# Patient Record
Sex: Male | Born: 1970 | Race: White | Hispanic: No | Marital: Married | Smoking: Former smoker
Health system: Southern US, Community
[De-identification: ages and names within clinical notes are randomized; demographics above are authoritative.]

## PROBLEM LIST (undated history)

## (undated) DIAGNOSIS — K219 Gastro-esophageal reflux disease without esophagitis: Secondary | ICD-10-CM

## (undated) DIAGNOSIS — F419 Anxiety disorder, unspecified: Secondary | ICD-10-CM

## (undated) HISTORY — PX: WISDOM TOOTH EXTRACTION: SHX21

## (undated) HISTORY — PX: VASECTOMY: SHX75

## (undated) HISTORY — PX: OTHER SURGICAL HISTORY: SHX169

---

## 2001-04-13 ENCOUNTER — Emergency Department (HOSPITAL_COMMUNITY): Admission: EM | Admit: 2001-04-13 | Discharge: 2001-04-14 | Payer: Self-pay | Admitting: Emergency Medicine

## 2007-12-19 ENCOUNTER — Encounter: Admission: RE | Admit: 2007-12-19 | Discharge: 2007-12-19 | Payer: Self-pay | Admitting: Internal Medicine

## 2008-06-17 ENCOUNTER — Encounter: Admission: RE | Admit: 2008-06-17 | Discharge: 2008-06-17 | Payer: Self-pay | Admitting: Internal Medicine

## 2008-11-24 ENCOUNTER — Encounter: Admission: RE | Admit: 2008-11-24 | Discharge: 2008-11-24 | Payer: Self-pay | Admitting: Internal Medicine

## 2008-12-29 ENCOUNTER — Encounter: Admission: RE | Admit: 2008-12-29 | Discharge: 2008-12-29 | Payer: Self-pay | Admitting: Internal Medicine

## 2009-07-01 ENCOUNTER — Encounter: Admission: RE | Admit: 2009-07-01 | Discharge: 2009-07-01 | Payer: Self-pay | Admitting: Internal Medicine

## 2010-11-14 ENCOUNTER — Encounter: Payer: Self-pay | Admitting: Internal Medicine

## 2011-09-09 ENCOUNTER — Emergency Department (HOSPITAL_COMMUNITY)
Admission: EM | Admit: 2011-09-09 | Discharge: 2011-09-10 | Disposition: A | Discharge status: Home / Self Care | Payer: Managed Care, Other (non HMO) | Attending: Emergency Medicine | Admitting: Emergency Medicine

## 2011-09-09 ENCOUNTER — Emergency Department (HOSPITAL_COMMUNITY): Payer: Managed Care, Other (non HMO)

## 2011-09-09 ENCOUNTER — Encounter: Payer: Self-pay | Admitting: Emergency Medicine

## 2011-09-09 DIAGNOSIS — M542 Cervicalgia: Secondary | ICD-10-CM | POA: Insufficient documentation

## 2011-09-09 DIAGNOSIS — R51 Headache: Secondary | ICD-10-CM | POA: Insufficient documentation

## 2011-09-09 HISTORY — DX: Gastro-esophageal reflux disease without esophagitis: K21.9

## 2011-09-09 LAB — POCT I-STAT, CHEM 8
BUN: 17 mg/dL (ref 6–23)
Creatinine, Ser: 1 mg/dL (ref 0.50–1.35)
Hemoglobin: 17 g/dL (ref 13.0–17.0)
Potassium: 4 mEq/L (ref 3.5–5.1)
Sodium: 142 mEq/L (ref 135–145)

## 2011-09-09 MED ORDER — OXYCODONE-ACETAMINOPHEN 5-325 MG PO TABS
1.0000 | ORAL_TABLET | Freq: Once | ORAL | Status: AC
  Administered 2011-09-09: 1 via ORAL
  Filled 2011-09-09: qty 1

## 2011-09-09 NOTE — ED Notes (Signed)
Pt states he is having "rice crispy popping" in the back of his head that radiates up to the top of his head where he is having the feeling of pressure. States it all started at the base of his skull and has worked its way up.  Pt states sxs started last Friday  Pt states he was sitting on the floor earlier today and felt dizzy like he was rocking like being on a boat.  Pt states he has had some numbness in both his hands esp in his little fingers and that his hands have been getting cold lately and he has to sit in front of a heater to warm them up   Pt states he went to his PCP yesterday and was told he may be having spasms in his neck and was given muscle relaxers  Pt states he has not taken any of them  Pt states he also has a dull ringing in his ears

## 2011-09-09 NOTE — ED Provider Notes (Cosign Needed)
History     CSN: [id number] Arrival date & time: 09/09/2011  9:05 PM   First MD Initiated Contact with Patient 09/09/11 2158      Chief Complaint  Patient presents with  . Headache    (Consider location/radiation/quality/duration/timing/severity/associated sxs/prior treatment) HPI Patient reports headache, mostly in the back of his head, described as pressure, and "popping" has been there for the past week. He did see his primary care physician who prescribed muscle relaxer and told him it was likely spasms. He he denies any nausea or vomiting. He denies any visual changes or speech deficits. He does state that his hands feel cold but denies any focal weakness. He's had no fevers. Denies any recent exposures. Patient did not take the muscle relaxers or prescribed. He smokes and drinks on occasion. Past Medical History  Diagnosis Date  . Acid reflux     Past Surgical History  Procedure Date  . Lymph nodes removed from groin area   . Vasectomy   . Wisdom tooth extraction     Family History  Problem Relation Age of Onset  . Thyroid disease Mother     History  Substance Use Topics  . Smoking status: Current Some Day Smoker    Types: Cigarettes  . Smokeless tobacco: Not on file  . Alcohol Use: 8.4 oz/week    14 Cans of beer per week      Review of Systems  All other systems reviewed and are negative.    Allergies  Review of patient's allergies indicates no known allergies.  Home Medications   Current Outpatient Rx  Name Route Sig Dispense Refill  . ASPIRIN PO Oral Take 1 tablet by mouth daily as needed. For headache or pain     . OMEPRAZOLE MAGNESIUM 20 MG PO TBEC Oral Take 20 mg by mouth daily.       BP 137/73  Pulse 86  Temp(Src) 98.1 F (36.7 C) (Oral)  Resp 18  SpO2 98%  Physical Exam  Constitutional: He is oriented to person, place, and time. He appears well-developed and well-nourished.  HENT:  Head: Normocephalic and atraumatic.  Eyes:  Conjunctivae and EOM are normal. Pupils are equal, round, and reactive to light.  Neck: Neck supple.  Cardiovascular: Normal rate and regular rhythm.  Exam reveals no gallop and no friction rub.   No murmur heard. Pulmonary/Chest: Breath sounds normal. He has no wheezes. He has no rales. He exhibits no tenderness.  Abdominal: Soft. Bowel sounds are normal. He exhibits no distension. There is no tenderness. There is no rebound and no guarding.  Musculoskeletal: Normal range of motion.  Neurological: He is alert and oriented to person, place, and time. He has normal reflexes. No cranial nerve deficit. Coordination normal.       Grip strength equal, deep tendon reflexes equal and symmetric, awake, alert. Essentially, normal exam.  Skin: Skin is warm and dry. No rash noted.  Psychiatric: He has a normal mood and affect.    ED Course  Procedures (including critical care time)  Labs Reviewed - No data to display No results found.   No diagnosis found.    MDM  Pt is seen and examined;  Initial history and physical completed.  Will follow.          Mitsue Peery A. Patrica Duel, MD 09/09/11 2306  Results for orders placed during the hospital encounter of 09/09/11  POCT I-STAT, CHEM 8      Component Value Range   Sodium 142  135 - 145 (mEq/L)   Potassium 4.0  3.5 - 5.1 (mEq/L)   Chloride 106  96 - 112 (mEq/L)   BUN 17  6 - 23 (mg/dL)   Creatinine, Ser 0.45  0.50 - 1.35 (mg/dL)   Glucose, Bld 409 (*) 70 - 99 (mg/dL)   Calcium, Ion 8.11  9.14 - 1.32 (mmol/L)   TCO2 26  0 - 100 (mmol/L)   Hemoglobin 17.0  13.0 - 17.0 (g/dL)   HCT 78.2  95.6 - 21.3 (%)   Ct Head Wo Contrast  09/09/2011  *RADIOLOGY REPORT*  Clinical Data: Headache, neck pain.  CT HEAD WITHOUT CONTRAST,CT CERVICAL SPINE WITHOUT CONTRAST  Technique:  Contiguous axial images were obtained from the base of the skull through the vertex without contrast.,Technique: Multidetector CT imaging of the cervical spine was performed.  Multiplanar CT image reconstructions were also generated.  Comparison: None.  Findings:  Head: There is no evidence for acute hemorrhage, hydrocephalus, mass lesion, or abnormal extra-axial fluid collection.  No definite CT evidence for acute infarction.  The visualized paranasal sinuses and mastoid air cells are predominately clear.  Cervical spine:  Maintained craniocervical relationship.  Loss of normal cervical lordosis.  Vertebral body height and alignment otherwise maintained.  Intervertebral disc spaces are maintained. No prevertebral or paravertebral soft tissue swelling.  No aggressive osseous abnormality.  The lung apices are clear.  There is a nonspecific 8 mm hypodensity within the right lobe of thyroid gland.  IMPRESSION: No acute intracranial abnormality.  Loss of normal cervical lordosis is likely positional or secondary to muscle spasm.  No acute fracture or dislocation identified.  8 mm hypodensity within the right lobe of the thyroid gland.  Can be further characterize with a non emergent ultrasound follow-up.  Original Report Authenticated By: Waneta Martins, M.D.   Ct Cervical Spine Wo Contrast  09/09/2011  *RADIOLOGY REPORT*  Clinical Data: Headache, neck pain.  CT HEAD WITHOUT CONTRAST,CT CERVICAL SPINE WITHOUT CONTRAST  Technique:  Contiguous axial images were obtained from the base of the skull through the vertex without contrast.,Technique: Multidetector CT imaging of the cervical spine was performed. Multiplanar CT image reconstructions were also generated.  Comparison: None.  Findings:  Head: There is no evidence for acute hemorrhage, hydrocephalus, mass lesion, or abnormal extra-axial fluid collection.  No definite CT evidence for acute infarction.  The visualized paranasal sinuses and mastoid air cells are predominately clear.  Cervical spine:  Maintained craniocervical relationship.  Loss of normal cervical lordosis.  Vertebral body height and alignment otherwise maintained.   Intervertebral disc spaces are maintained. No prevertebral or paravertebral soft tissue swelling.  No aggressive osseous abnormality.  The lung apices are clear.  There is a nonspecific 8 mm hypodensity within the right lobe of thyroid gland.  IMPRESSION: No acute intracranial abnormality.  Loss of normal cervical lordosis is likely positional or secondary to muscle spasm.  No acute fracture or dislocation identified.  8 mm hypodensity within the right lobe of the thyroid gland.  Can be further characterize with a non emergent ultrasound follow-up.  Original Report Authenticated By: Waneta Martins, M.D.      Armando Bukhari A. Patrica Duel, MD 09/10/11 0865

## 2011-09-09 NOTE — ED Notes (Signed)
Patient transported to CT 

## 2011-09-09 NOTE — ED Notes (Signed)
Agree with triage note.  Pt states that he has had 'rice crispy' sounds on the back of his head and into his ears.  Pt states that he was kneeling down when he felt dizzy (room spinning).  Pt has normal neuro exam.  Pt states that family has had some flu like symptoms.  Pt states that he has had some heartburn that has been treating with priolosec.  Pt is off/on smoker.  Pt states slight tingling to both hands on 4th-5th digits.  Pt states that this occurs off and on.

## 2013-02-14 ENCOUNTER — Emergency Department (HOSPITAL_COMMUNITY)
Admission: EM | Admit: 2013-02-14 | Discharge: 2013-02-14 | Disposition: A | Discharge status: Home / Self Care | Payer: Managed Care, Other (non HMO) | Attending: Emergency Medicine | Admitting: Emergency Medicine

## 2013-02-14 ENCOUNTER — Encounter (HOSPITAL_COMMUNITY): Payer: Self-pay | Admitting: *Deleted

## 2013-02-14 ENCOUNTER — Emergency Department (HOSPITAL_COMMUNITY): Payer: Managed Care, Other (non HMO)

## 2013-02-14 DIAGNOSIS — N201 Calculus of ureter: Secondary | ICD-10-CM | POA: Insufficient documentation

## 2013-02-14 DIAGNOSIS — K219 Gastro-esophageal reflux disease without esophagitis: Secondary | ICD-10-CM | POA: Insufficient documentation

## 2013-02-14 DIAGNOSIS — R300 Dysuria: Secondary | ICD-10-CM | POA: Insufficient documentation

## 2013-02-14 DIAGNOSIS — Z79899 Other long term (current) drug therapy: Secondary | ICD-10-CM | POA: Insufficient documentation

## 2013-02-14 DIAGNOSIS — K5900 Constipation, unspecified: Secondary | ICD-10-CM | POA: Insufficient documentation

## 2013-02-14 DIAGNOSIS — R350 Frequency of micturition: Secondary | ICD-10-CM | POA: Insufficient documentation

## 2013-02-14 DIAGNOSIS — F17200 Nicotine dependence, unspecified, uncomplicated: Secondary | ICD-10-CM | POA: Insufficient documentation

## 2013-02-14 LAB — URINALYSIS, ROUTINE W REFLEX MICROSCOPIC
Glucose, UA: NEGATIVE mg/dL
Leukocytes, UA: NEGATIVE
Specific Gravity, Urine: 1.024 (ref 1.005–1.030)
Urobilinogen, UA: 0.2 mg/dL (ref 0.0–1.0)

## 2013-02-14 LAB — URINE MICROSCOPIC-ADD ON

## 2013-02-14 MED ORDER — KETOROLAC TROMETHAMINE 30 MG/ML IJ SOLN
30.0000 mg | Freq: Once | INTRAMUSCULAR | Status: AC
  Administered 2013-02-14: 30 mg via INTRAVENOUS
  Filled 2013-02-14: qty 1

## 2013-02-14 MED ORDER — MORPHINE SULFATE 4 MG/ML IJ SOLN
6.0000 mg | Freq: Once | INTRAMUSCULAR | Status: DC
  Filled 2013-02-14: qty 2

## 2013-02-14 MED ORDER — OXYCODONE-ACETAMINOPHEN 5-325 MG PO TABS
2.0000 | ORAL_TABLET | Freq: Once | ORAL | Status: AC
  Administered 2013-02-14: 2 via ORAL
  Filled 2013-02-14: qty 2

## 2013-02-14 MED ORDER — OXYCODONE-ACETAMINOPHEN 5-325 MG PO TABS: 1.0000 | ORAL_TABLET | Freq: Four times a day (QID) | ORAL | Status: DC | PRN

## 2013-02-14 MED ORDER — TAMSULOSIN HCL 0.4 MG PO CAPS: 0.4000 mg | ORAL_CAPSULE | Freq: Every day | ORAL | Status: DC

## 2013-02-14 MED ORDER — ONDANSETRON HCL 4 MG PO TABS: 4.0000 mg | ORAL_TABLET | Freq: Four times a day (QID) | ORAL | Status: DC

## 2013-02-14 MED ORDER — ONDANSETRON 4 MG PO TBDP
8.0000 mg | ORAL_TABLET | Freq: Once | ORAL | Status: AC
  Administered 2013-02-14: 8 mg via ORAL
  Filled 2013-02-14: qty 2

## 2013-02-14 NOTE — ED Provider Notes (Signed)
History     CSN: [id number]  Arrival date & time 02/14/13  7829   First MD Initiated Contact with Patient 02/14/13 [phone]      Chief Complaint  Patient presents with  . Abdominal Pain    (Consider location/radiation/quality/duration/timing/severity/associated sxs/prior treatment) HPI Comments: 42 year old male with no significant past medical history presents to the emergency department complaining of sudden onset right lower quadrant abdominal pain beginning about 5 hours prior to arrival. He describes the pain is sharp and cramping, rated 9/10, radiating around to the right side of his back into his right testicle. He tried taking a Xanax to relax him without relief, however since onset pain has decreased to 7/10. Admits to difficulty urinating. He was able to urinate fine yesterday, however admits to increased frequency. Denies dysuria or hematuria. States it is "hard to poop" and had "rabbit pellet poops" about two hours ago. Last normal bowel movement was later in the day yesterday. Denies associated nausea, vomiting, diaphoresis, fever or chills.  Patient is a 42 y.o. male presenting with abdominal pain. The history is provided by the patient.  Abdominal Pain Associated symptoms: constipation   Associated symptoms: no chest pain, no chills, no dysuria, no fever, no hematuria, no nausea, no shortness of breath and no vomiting     Past Medical History  Diagnosis Date  . Acid reflux     Past Surgical History  Procedure Laterality Date  . Lymph nodes removed from groin area    . Vasectomy    . Wisdom tooth extraction      Family History  Problem Relation Age of Onset  . Thyroid disease Mother     History  Substance Use Topics  . Smoking status: Current Some Day Smoker    Types: Cigarettes  . Smokeless tobacco: Not on file  . Alcohol Use: 8.4 oz/week    14 Cans of beer per week      Review of Systems  Constitutional: Negative for fever, chills, diaphoresis and  appetite change.  Respiratory: Negative for shortness of breath.   Cardiovascular: Negative for chest pain.  Gastrointestinal: Positive for abdominal pain and constipation. Negative for nausea, vomiting and blood in stool.  Genitourinary: Positive for frequency, flank pain and difficulty urinating. Negative for dysuria and hematuria.  All other systems reviewed and are negative.    Allergies  Review of patient's allergies indicates no known allergies.  Home Medications   Current Outpatient Rx  Name  Route  Sig  Dispense  Refill  . ASPIRIN PO   Oral   Take 1 tablet by mouth daily as needed. For headache or pain          . omeprazole (PRILOSEC OTC) 20 MG tablet   Oral   Take 20 mg by mouth daily.            There were no vitals taken for this visit.  Physical Exam  Nursing note and vitals reviewed. Constitutional: He is oriented to person, place, and time. He appears well-developed and well-nourished. No distress.  HENT:  Head: Normocephalic and atraumatic.  Mouth/Throat: Oropharynx is clear and moist.  Eyes: Conjunctivae are normal.  Neck: Normal range of motion. Neck supple.  Cardiovascular: Normal rate, regular rhythm, normal heart sounds and intact distal pulses.   Pulmonary/Chest: Effort normal and breath sounds normal. No respiratory distress.  Abdominal: Soft. Normal appearance and bowel sounds are normal. He exhibits no distension and no mass. There is tenderness in the right lower quadrant. There  is CVA tenderness (right). There is no rigidity, no rebound and no guarding. Hernia confirmed negative in the right inguinal area and confirmed negative in the left inguinal area.    Genitourinary: Testes normal. Right testis shows no mass, no swelling and no tenderness. Left testis shows no mass, no swelling and no tenderness.  Musculoskeletal: Normal range of motion. He exhibits no edema.  Neurological: He is alert and oriented to person, place, and time.  Skin: Skin  is warm and dry.  Psychiatric: He has a normal mood and affect. His behavior is normal.    ED Course  Procedures (including critical care time)  Labs Reviewed  URINALYSIS, ROUTINE W REFLEX MICROSCOPIC - Abnormal; Notable for the following:    Color, Urine AMBER (*)    APPearance CLOUDY (*)    Hgb urine dipstick LARGE (*)    All other components within normal limits  URINE MICROSCOPIC-ADD ON - Abnormal; Notable for the following:    Bacteria, UA MANY (*)    All other components within normal limits   Ct Abdomen Pelvis Wo Contrast  02/14/2013  *RADIOLOGY REPORT*  Clinical Data: Right lower quadrant and right flank pain.  CT ABDOMEN AND PELVIS WITHOUT CONTRAST  Technique:  Multidetector CT imaging of the abdomen and pelvis was performed following the standard protocol without intravenous contrast.  Comparison: None.  Findings: The lung bases are clear.  No pleural or pericardial effusion.  There is mild right hydronephrosis with some stranding about the right kidney and ureter due to a 0.3 cm right ureteral stone just proximal to the UVJ.  The patient has a punctate nonobstructing stone in the upper pole of the left kidney.  The kidneys are otherwise unremarkable.  The gallbladder, liver, spleen, adrenal glands and pancreas appear normal.  No lymphadenopathy or fluid is identified.  The stomach, small and large bowel and appendix appear normal.  No focal bony abnormality is identified.  IMPRESSION: Mild right hydronephrosis due to a 0.3 cm right ureteral stone just proximal to the UVJ.  Tiny nonobstructing stone upper pole left kidney also noted.   Original Report Authenticated By: Holley Dexter, M.D.      1. Ureteral stone       MDM  Probable kidney stone- obtaining CT abdomen/pelvis without contrast, U/A. Percocet for pain. NAD. 8:39 AM Patient beginning to feel nauseated, will give zofran. 10:24 AM CT scan showing 0.3 cm right ureteral stone, no obstruction, mild hydronephrosis.  Patient comfortable, in NAD. Discharged with percocet, zofran, flomax and urology f/u. No UTI associated. Return precautions discussed. Patient states understanding of plan and is agreeable.        Trevor Mace, PA-C 02/14/13 1025

## 2013-02-14 NOTE — ED Notes (Signed)
Pt in from home c/o RLQ abd pain onset today @ 3:00, pt states, "It is hard to poop & pee. I will try to pee & it just dribbles. I just feel backed up." pt denies N/V/D, SOB, CP, pt A&O x4, follows commands, speaks in complete sentences

## 2013-02-14 NOTE — ED Provider Notes (Signed)
Medical screening examination/treatment/procedure(s) were conducted as a shared visit with non-physician practitioner(s) and myself.  I personally evaluated the patient during the encounter  Right sided ureteral stone.  Improved at this time.  Discharge home in good condition with urology followup.  Lyanne Co, MD 02/14/13 1048

## 2013-05-09 IMAGING — CT CT ABD-PELV W/O CM
2 of 4 series · 17 of 46 positions shown, 19 images · non-contrast
Comparison: None.

CLINICAL DATA: Right lower quadrant and right flank pain.

CT ABDOMEN AND PELVIS WITHOUT CONTRAST
TECHNIQUE: Multidetector CT imaging of the abdomen and pelvis was
performed following the standard protocol without intravenous
contrast.

[Series 2: stone >200 lbs 5.0 b31f st · axial · 0.75mm/px · z∈[-461,-36]mm · 14 of 93 slices shown, 16 images]
[im 4/93  soft-tissue]
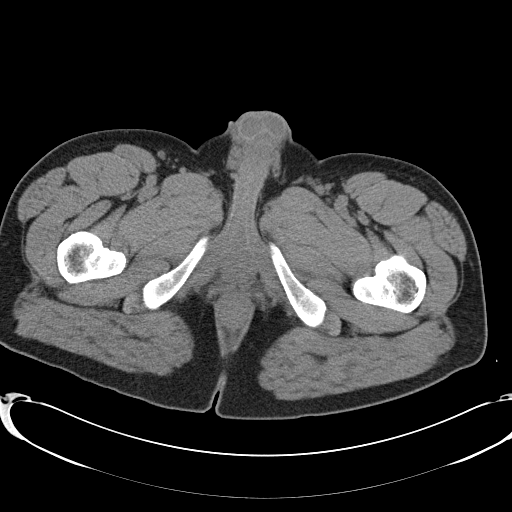
[im 4/93  bone]
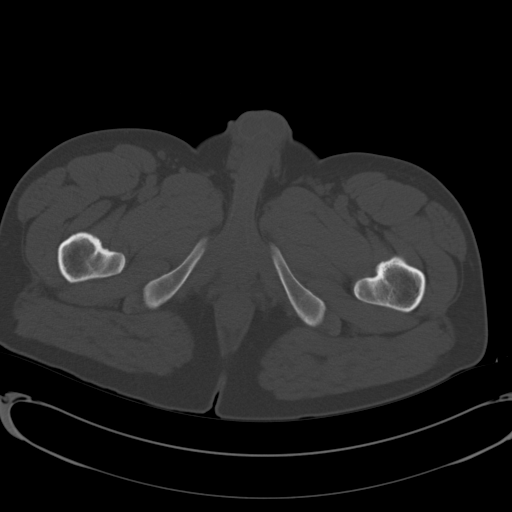
[im 12/93  soft-tissue]
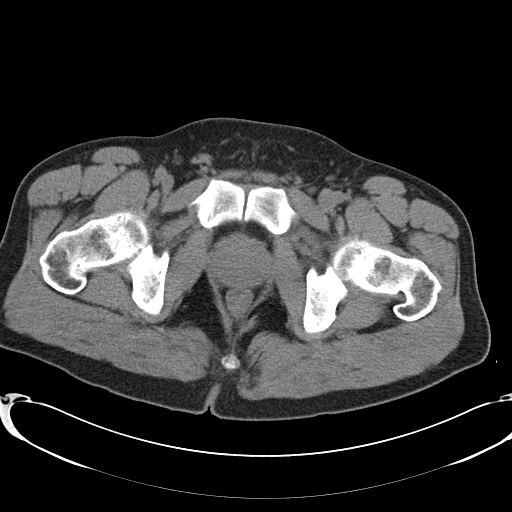
[im 19/93  soft-tissue]
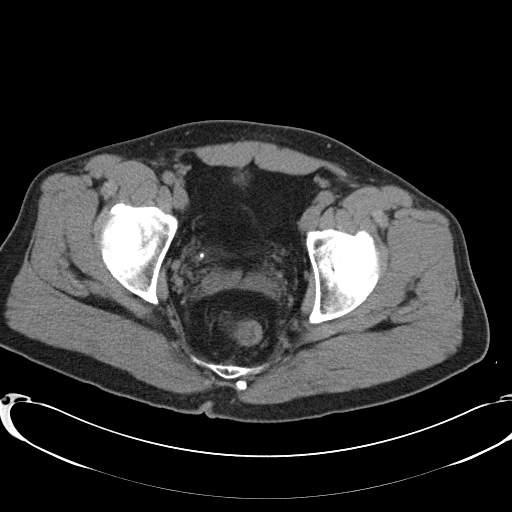
[im 26/93  soft-tissue]
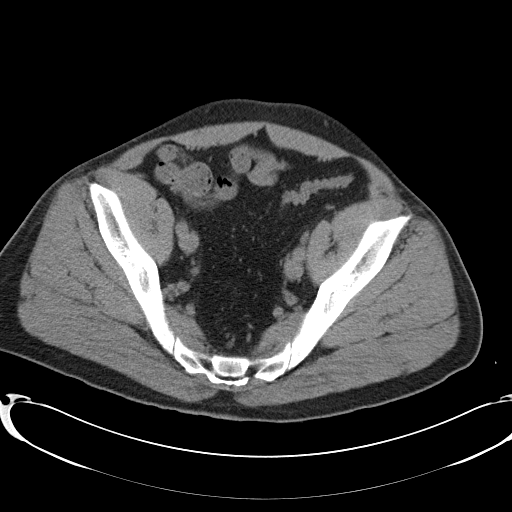
[im 30/93  soft-tissue]
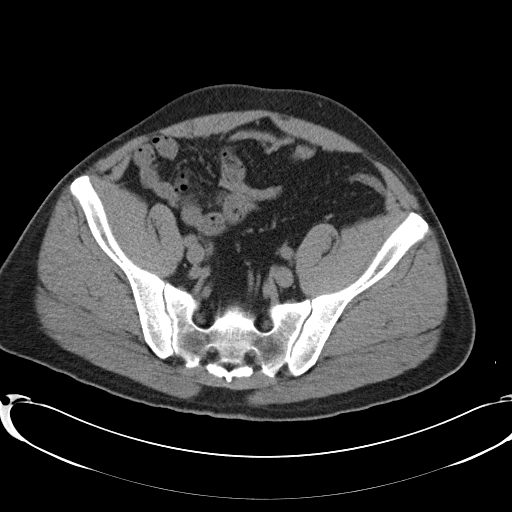
[im 37/93  soft-tissue]
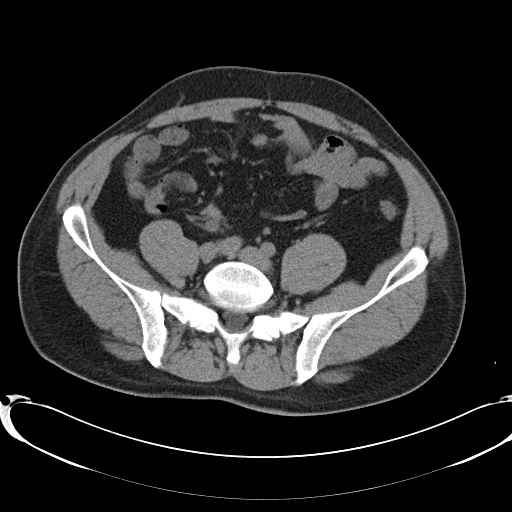
[im 45/93  soft-tissue]
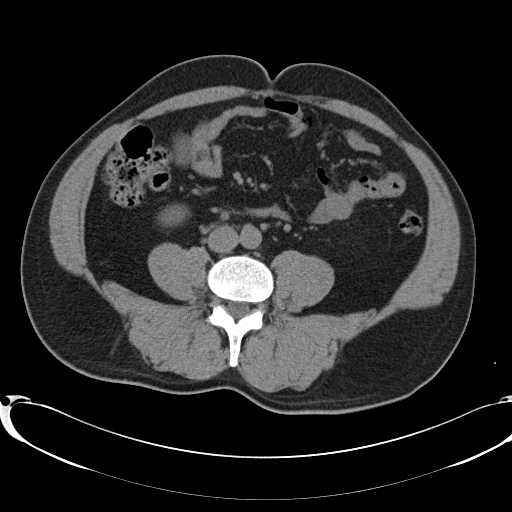
[im 48/93  soft-tissue]
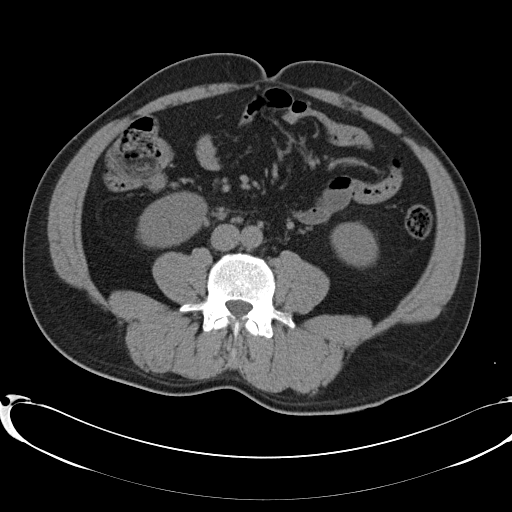
[im 56/93  soft-tissue]
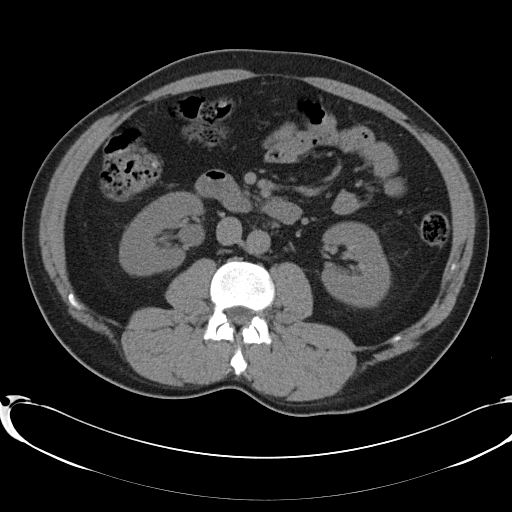
[im 56/93  bone]
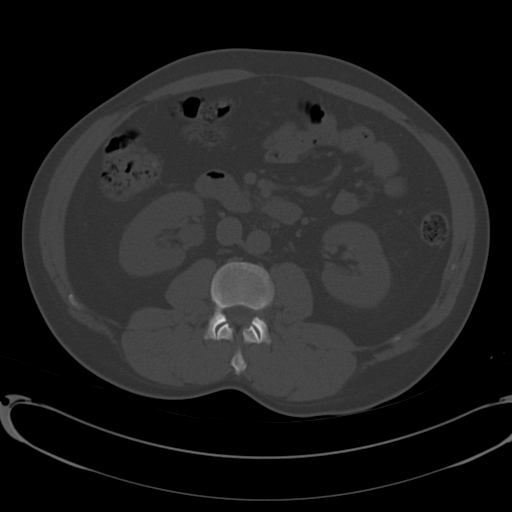
[im 63/93  soft-tissue]
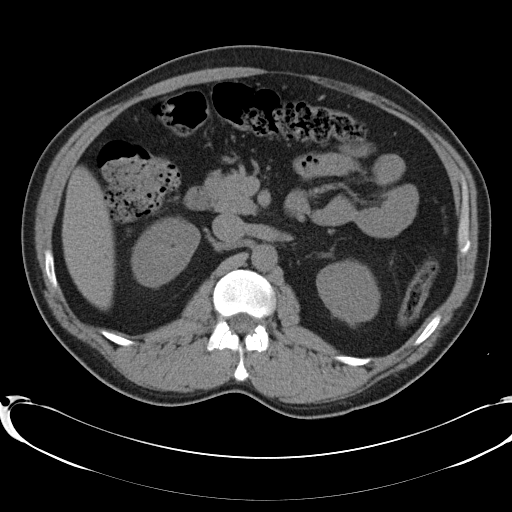
[im 70/93  soft-tissue]
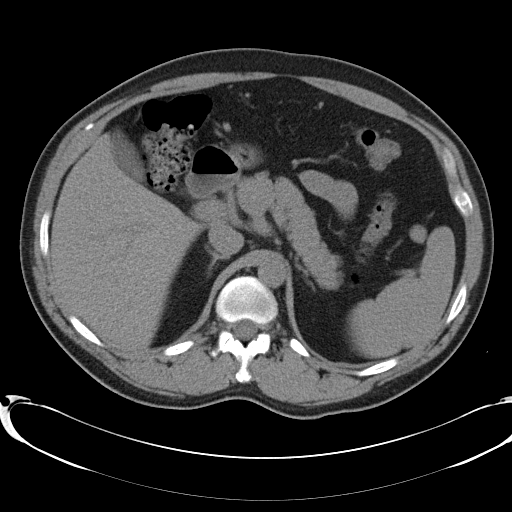
[im 74/93  soft-tissue]
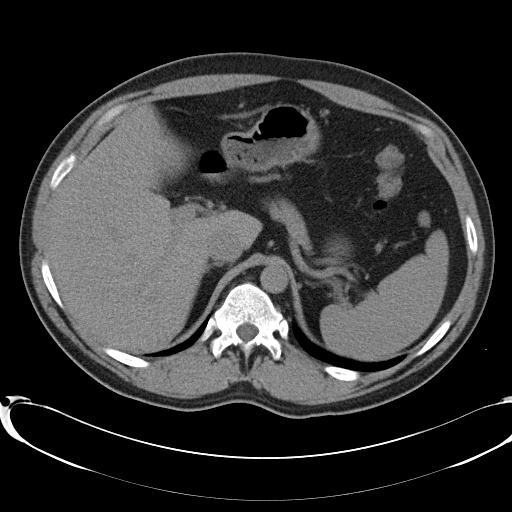
[im 81/93  soft-tissue]
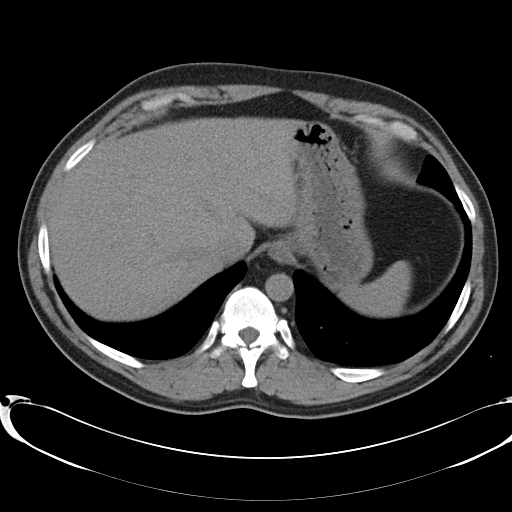
[im 89/93  soft-tissue]
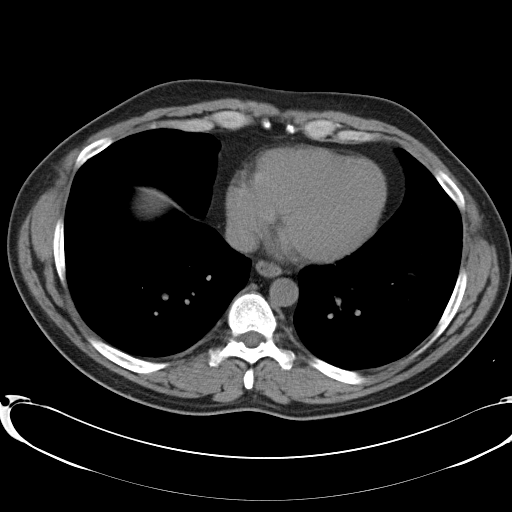

[Series 5: coronals · coronal · 0.91mm/px · 3 of 125 slices shown]
[im 42/125  soft-tissue]
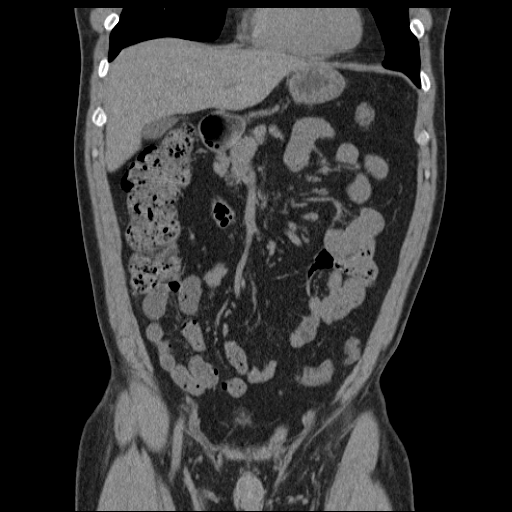
[im 56/125  soft-tissue]
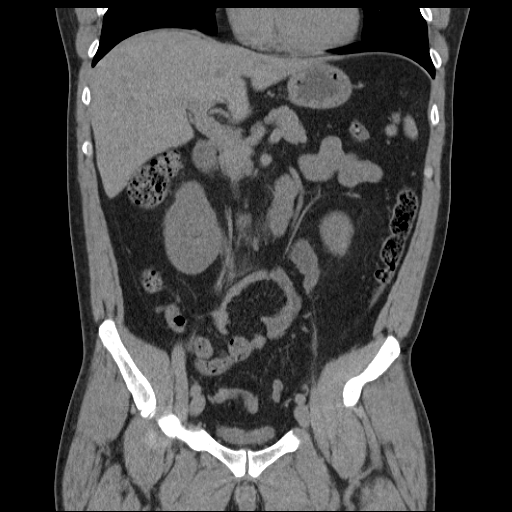
[im 69/125  soft-tissue]
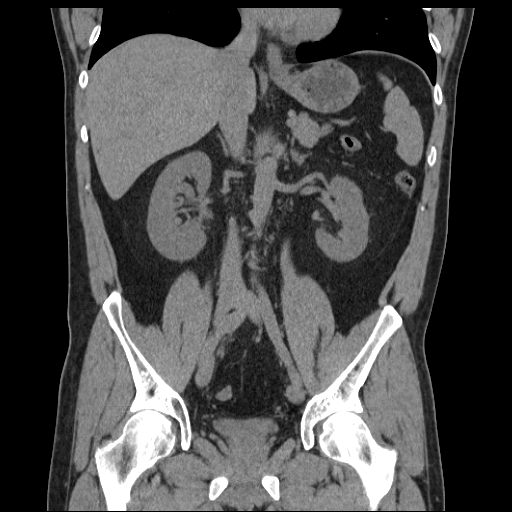

[17 of 46 positions shown; findings below may reference images not displayed]

FINDINGS: The lung bases are clear.  No pleural or pericardial
effusion.

There is mild right hydronephrosis with some stranding about the
right kidney and ureter due to a 0.3 cm right ureteral stone just
proximal to the UVJ.  The patient has a punctate nonobstructing
stone in the upper pole of the left kidney.  The kidneys are
otherwise unremarkable.

The gallbladder, liver, spleen, adrenal glands and pancreas appear
normal.  No lymphadenopathy or fluid is identified.  The stomach,
small and large bowel and appendix appear normal.  No focal bony
abnormality is identified.
IMPRESSION: Mild right hydronephrosis due to a 0.3 cm right ureteral stone just
proximal to the UVJ.  Tiny nonobstructing stone upper pole left
kidney also noted.

## 2014-05-26 ENCOUNTER — Emergency Department (HOSPITAL_COMMUNITY)
Admission: EM | Admit: 2014-05-26 | Discharge: 2014-05-27 | Disposition: A | Discharge status: Home / Self Care | Payer: BC Managed Care – PPO | Attending: Emergency Medicine | Admitting: Emergency Medicine

## 2014-05-26 ENCOUNTER — Other Ambulatory Visit: Payer: Self-pay

## 2014-05-26 ENCOUNTER — Encounter (HOSPITAL_COMMUNITY): Payer: Self-pay | Admitting: Emergency Medicine

## 2014-05-26 DIAGNOSIS — K2990 Gastroduodenitis, unspecified, without bleeding: Secondary | ICD-10-CM

## 2014-05-26 DIAGNOSIS — Z7982 Long term (current) use of aspirin: Secondary | ICD-10-CM | POA: Insufficient documentation

## 2014-05-26 DIAGNOSIS — Z87891 Personal history of nicotine dependence: Secondary | ICD-10-CM | POA: Insufficient documentation

## 2014-05-26 DIAGNOSIS — F411 Generalized anxiety disorder: Secondary | ICD-10-CM | POA: Insufficient documentation

## 2014-05-26 DIAGNOSIS — K2970 Gastritis, unspecified, without bleeding: Secondary | ICD-10-CM | POA: Insufficient documentation

## 2014-05-26 DIAGNOSIS — Z79899 Other long term (current) drug therapy: Secondary | ICD-10-CM | POA: Insufficient documentation

## 2014-05-26 DIAGNOSIS — R079 Chest pain, unspecified: Secondary | ICD-10-CM

## 2014-05-26 HISTORY — DX: Anxiety disorder, unspecified: F41.9

## 2014-05-26 NOTE — ED Notes (Signed)
Pt states he has been having intermittent chest pain for about a week  Pt states it has become more constant over the past couple of days with periods of pain radiating to his left jaw and left arm at times  Pt states he feels tired and fatigued  Pt states he has been having some nausea but denies any other symptoms

## 2014-05-26 NOTE — ED Notes (Signed)
Pt states he took 2 regular aspirin and a baby aspirin and a prilosec today for the pain and it has not relieved the pain

## 2014-05-27 ENCOUNTER — Emergency Department (HOSPITAL_COMMUNITY): Payer: BC Managed Care – PPO

## 2014-05-27 LAB — CBC
HCT: 46.5 % (ref 39.0–52.0)
HEMOGLOBIN: 16.3 g/dL (ref 13.0–17.0)
MCH: 31.1 pg (ref 26.0–34.0)
MCHC: 35.1 g/dL (ref 30.0–36.0)
MCV: 88.7 fL (ref 78.0–100.0)
Platelets: 165 10*3/uL (ref 150–400)
RBC: 5.24 MIL/uL (ref 4.22–5.81)
RDW: 12.7 % (ref 11.5–15.5)
WBC: 7.8 10*3/uL (ref 4.0–10.5)

## 2014-05-27 LAB — BASIC METABOLIC PANEL
Anion gap: 10 (ref 5–15)
BUN: 13 mg/dL (ref 6–23)
CALCIUM: 9.2 mg/dL (ref 8.4–10.5)
CO2: 25 meq/L (ref 19–32)
CREATININE: 1.02 mg/dL (ref 0.50–1.35)
Chloride: 102 mEq/L (ref 96–112)
GFR calc Af Amer: >90 mL/min (ref >90)
GFR calc non Af Amer: 90 mL/min — ABNORMAL LOW (ref >90)
GLUCOSE: 96 mg/dL (ref 70–99)
Potassium: 4.2 mEq/L (ref 3.7–5.3)
SODIUM: 137 meq/L (ref 137–147)

## 2014-05-27 LAB — I-STAT TROPONIN, ED: Troponin i, poc: 0 ng/mL (ref 0.00–0.08)

## 2014-05-27 LAB — HEPATIC FUNCTION PANEL
ALBUMIN: 3.6 g/dL (ref 3.5–5.2)
ALK PHOS: 59 U/L (ref 39–117)
ALT: 28 U/L (ref 0–53)
AST: 27 U/L (ref 0–37)
BILIRUBIN TOTAL: 0.3 mg/dL (ref 0.3–1.2)
Total Protein: 7.1 g/dL (ref 6.0–8.3)

## 2014-05-27 LAB — LIPASE, BLOOD: Lipase: 88 U/L — ABNORMAL HIGH (ref 11–59)

## 2014-05-27 MED ORDER — GI COCKTAIL ~~LOC~~
30.0000 mL | Freq: Once | ORAL | Status: AC
  Administered 2014-05-27: 30 mL via ORAL
  Filled 2014-05-27: qty 30

## 2014-05-27 MED ORDER — LANSOPRAZOLE 30 MG PO CPDR: 30.0000 mg | DELAYED_RELEASE_CAPSULE | Freq: Two times a day (BID) | ORAL | Status: AC

## 2014-05-27 NOTE — ED Provider Notes (Signed)
CSN: [id number]     Arrival date & time 05/26/14  2340 History   First MD Initiated Contact with Patient 05/27/14 0116     Chief Complaint  Patient presents with  . Chest Pain      HPI Patient states she's had some intermittent chest discomfort over the past one to 2 weeks.  He reports constant chest pain for all of today.  His central in nature.  He states his had gastroesophageal reflux disease that seems to be worsening over the past several weeks has tried increase Prevacid without improvement in his symptoms.  Denies shortness of breath.  He reports he went for a run this morning to see if this would help his discomfort in his chest without any improvement in his symptoms.  Running did not worsen his discomfort.  His had some occasional intermittent left jaw pain.  No history of hypertension, hyperlipidemia, diabetes.  He does not smoke cigarettes.  No family history of early heart disease.  No fevers or chills.  No cough or congestion.  Denies unilateral leg swelling.  Denies abdominal pain.  His wife reports that he does seem like he is sleeping more than usual.  Currently no active jaw or arm pain.   Past Medical History  Diagnosis Date  . Acid reflux   . Anxiety    Past Surgical History  Procedure Laterality Date  . Lymph nodes removed from groin area    . Vasectomy    . Wisdom tooth extraction     Family History  Problem Relation Age of Onset  . Thyroid disease Mother   . Cancer Father    History  Substance Use Topics  . Smoking status: Former Smoker    Types: Cigarettes  . Smokeless tobacco: Not on file  . Alcohol Use: Yes     Comment: occ    Review of Systems  All other systems reviewed and are negative.     Allergies  Review of patient's allergies indicates no known allergies.  Home Medications   Prior to Admission medications   Medication Sig Start Date End Date Taking? Authorizing Provider  ALPRAZolam Prudy Feeler(XANAX) 0.5 MG tablet Take 0.25 mg by mouth daily.     Yes Historical Provider, MD  aspirin 325 MG tablet Take 650 mg by mouth daily.   Yes Historical Provider, MD  cetirizine-pseudoephedrine (ZYRTEC-D) 5-120 MG per tablet Take 1 tablet by mouth 2 (two) times daily.   Yes Historical Provider, MD  lansoprazole (PREVACID) 30 MG capsule Take 30 mg by mouth daily as needed (for heartburn).   Yes Historical Provider, MD  Simethicone (GAS-X EXTRA STRENGTH) 125 MG CAPS Take 1 capsule by mouth every 8 (eight) hours as needed (Gas).   Yes Historical Provider, MD   BP 111/78  Pulse 75  Temp(Src) 97.6 F (36.4 C) (Oral)  Resp 18  SpO2 100% Physical Exam  Nursing note and vitals reviewed. Constitutional: He is oriented to person, place, and time. He appears well-developed and well-nourished.  HENT:  Head: Normocephalic and atraumatic.  Eyes: EOM are normal.  Neck: Normal range of motion.  Cardiovascular: Normal rate, regular rhythm, normal heart sounds and intact distal pulses.   Pulmonary/Chest: Effort normal and breath sounds normal. No respiratory distress.  Abdominal: Soft. He exhibits no distension. There is no tenderness.  Musculoskeletal: Normal range of motion.  Neurological: He is alert and oriented to person, place, and time.  Skin: Skin is warm and dry.  Psychiatric: He has a normal mood  and affect. Judgment normal.    ED Course  Procedures (including critical care time) Labs Review Labs Reviewed  BASIC METABOLIC PANEL - Abnormal; Notable for the following:    GFR calc non Af Amer 90 (*)    All other components within normal limits  LIPASE, BLOOD - Abnormal; Notable for the following:    Lipase 88 (*)    All other components within normal limits  CBC  HEPATIC FUNCTION PANEL  Rosezena Sensor, ED    Imaging Review Dg Chest Port 1 View  05/27/2014   CLINICAL DATA:  Chest pain  EXAM: PORTABLE CHEST - 1 VIEW  COMPARISON:  None.  FINDINGS: The heart size and mediastinal contours are within normal limits. Both lungs are clear. The  visualized skeletal structures are unremarkable.  IMPRESSION: No active disease.   Electronically Signed   By: Marlan Palau M.D.   On: 05/27/2014 01:10    ECG interpretation   Date: 05/27/2014  Rate: 83  Rhythm: normal sinus rhythm  QRS Axis: normal  Intervals: normal  ST/T Wave abnormalities: normal  Conduction Disutrbances: none  Narrative Interpretation:   Old EKG Reviewed: No significant changes noted     MDM   Final diagnoses:  None    Exertion does not seem to worsen his symptoms.  My suspicion for ACS is low.  Constant chest pain today and a negative troponin with normal EKG.  No cardiac risk factors.  Improvement after GI cocktail in the emergency department.  I will increase the patient's Prevacid to twice a day and asked that he followup with gastroenterology.  He may benefit from endoscopy as this is likely more of an esophageal/gastric issue    Lyanne Co, MD 05/27/14 [phone]

## 2014-05-27 NOTE — Discharge Instructions (Signed)
Chest Pain (Nonspecific) °It is often hard to give a specific diagnosis for the cause of chest pain. There is always a chance that your pain could be related to something serious, such as a heart attack or a blood clot in the lungs. You need to follow up with your health care provider for further evaluation. °CAUSES  °· Heartburn. °· Pneumonia or bronchitis. °· Anxiety or stress. °· Inflammation around your heart (pericarditis) or lung (pleuritis or pleurisy). °· A blood clot in the lung. °· A collapsed lung (pneumothorax). It can develop suddenly on its own (spontaneous pneumothorax) or from trauma to the chest. °· Shingles infection (herpes zoster virus). °The chest wall is composed of bones, muscles, and cartilage. Any of these can be the source of the pain. °· The bones can be bruised by injury. °· The muscles or cartilage can be strained by coughing or overwork. °· The cartilage can be affected by inflammation and become sore (costochondritis). °DIAGNOSIS  °Lab tests or other studies may be needed to find the cause of your pain. Your health care provider may have you take a test called an ambulatory electrocardiogram (ECG). An ECG records your heartbeat patterns over a 24-hour period. You may also have other tests, such as: °· Transthoracic echocardiogram (TTE). During echocardiography, sound waves are used to evaluate how blood flows through your heart. °· Transesophageal echocardiogram (TEE). °· Cardiac monitoring. This allows your health care provider to monitor your heart rate and rhythm in real time. °· Holter monitor. This is a portable device that records your heartbeat and can help diagnose heart arrhythmias. It allows your health care provider to track your heart activity for several days, if needed. °· Stress tests by exercise or by giving medicine that makes the heart beat faster. °TREATMENT  °· Treatment depends on what may be causing your chest pain. Treatment may include: °· Acid blockers for  heartburn. °· Anti-inflammatory medicine. °· Pain medicine for inflammatory conditions. °· Antibiotics if an infection is present. °· You may be advised to change lifestyle habits. This includes stopping smoking and avoiding alcohol, caffeine, and chocolate. °· You may be advised to keep your head raised (elevated) when sleeping. This reduces the chance of acid going backward from your stomach into your esophagus. °Most of the time, nonspecific chest pain will improve within 2-3 days with rest and mild pain medicine.  °HOME CARE INSTRUCTIONS  °· If antibiotics were prescribed, take them as directed. Finish them even if you start to feel better. °· For the next few days, avoid physical activities that bring on chest pain. Continue physical activities as directed. °· Do not use any tobacco products, including cigarettes, chewing tobacco, or electronic cigarettes. °· Avoid drinking alcohol. °· Only take medicine as directed by your health care provider. °· Follow your health care provider's suggestions for further testing if your chest pain does not go away. °· Keep any follow-up appointments you made. If you do not go to an appointment, you could develop lasting (chronic) problems with pain. If there is any problem keeping an appointment, call to reschedule. °SEEK MEDICAL CARE IF:  °· Your chest pain does not go away, even after treatment. °· You have a rash with blisters on your chest. °· You have a fever. °SEEK IMMEDIATE MEDICAL CARE IF:  °· You have increased chest pain or pain that spreads to your arm, neck, jaw, back, or abdomen. °· You have shortness of breath. °· You have an increasing cough, or you cough   up blood. °· You have severe back or abdominal pain. °· You feel nauseous or vomit. °· You have severe weakness. °· You faint. °· You have chills. °This is an emergency. Do not wait to see if the pain will go away. Get medical help at once. Call your local emergency services (911 in [place] Do not drive  yourself to the hospital. °MAKE SURE YOU:  °· Understand these instructions. °· Will watch your condition. °· Will get help right away if you are not doing well or get worse. °Document Released: 07/20/2005 Document Revised: 10/15/2013 Document Reviewed: 05/15/2008 °[employer]® Patient Information ©2015 [employer]. This information is not intended to replace advice given to you by your health care provider. Make sure you discuss any questions you have with your health care provider. °Gastroesophageal Reflux Disease, Adult °Gastroesophageal reflux disease (GERD) happens when acid from your stomach flows up into the esophagus. When acid comes in contact with the esophagus, the acid causes soreness (inflammation) in the esophagus. Over time, GERD may create small holes (ulcers) in the lining of the esophagus. °CAUSES  °· Increased body weight. This puts pressure on the stomach, making acid rise from the stomach into the esophagus. °· Smoking. This increases acid production in the stomach. °· Drinking alcohol. This causes decreased pressure in the lower esophageal sphincter (valve or ring of muscle between the esophagus and stomach), allowing acid from the stomach into the esophagus. °· Late evening meals and a full stomach. This increases pressure and acid production in the stomach. °· A malformed lower esophageal sphincter. °Sometimes, no cause is found. °SYMPTOMS  °· Burning pain in the lower part of the mid-chest behind the breastbone and in the mid-stomach area. This may occur twice a week or more often. °· Trouble swallowing. °· Sore throat. °· Dry cough. °· Asthma-like symptoms including chest tightness, shortness of breath, or wheezing. °DIAGNOSIS  °Your caregiver may be able to diagnose GERD based on your symptoms. In some cases, X-rays and other tests may be done to check for complications or to check the condition of your stomach and esophagus. °TREATMENT  °Your caregiver may recommend over-the-counter or  prescription medicines to help decrease acid production. Ask your caregiver before starting or adding any new medicines.  °HOME CARE INSTRUCTIONS  °· Change the factors that you can control. Ask your caregiver for guidance concerning weight loss, quitting smoking, and alcohol consumption. °· Avoid foods and drinks that make your symptoms worse, such as: °¨ Caffeine or alcoholic drinks. °¨ Chocolate. °¨ Peppermint or mint flavorings. °¨ Garlic and onions. °¨ Spicy foods. °¨ Citrus fruits, such as oranges, lemons, or limes. °¨ Tomato-based foods such as sauce, chili, salsa, and pizza. °¨ Fried and fatty foods. °· Avoid lying down for the 3 hours prior to your bedtime or prior to taking a nap. °· Eat small, frequent meals instead of large meals. °· Wear loose-fitting clothing. Do not wear anything tight around your waist that causes pressure on your stomach. °· Raise the head of your bed 6 to 8 inches with wood blocks to help you sleep. Extra pillows will not help. °· Only take over-the-counter or prescription medicines for pain, discomfort, or fever as directed by your caregiver. °· Do not take aspirin, ibuprofen, or other nonsteroidal anti-inflammatory drugs (NSAIDs). °SEEK IMMEDIATE MEDICAL CARE IF:  °· You have pain in your arms, neck, jaw, teeth, or back. °· Your pain increases or changes in intensity or duration. °· You develop nausea, vomiting, or sweating (diaphoresis). °·   You develop shortness of breath, or you faint. °· Your vomit is green, yellow, black, or looks like coffee grounds or blood. °· Your stool is red, bloody, or black. °These symptoms could be signs of other problems, such as heart disease, gastric bleeding, or esophageal bleeding. °MAKE SURE YOU:  °· Understand these instructions. °· Will watch your condition. °· Will get help right away if you are not doing well or get worse. °Document Released: 07/20/2005 Document Revised: 01/02/2012 Document Reviewed: 04/29/2011 °[employer]Care® Patient  Information ©2015 [employer]. This information is not intended to replace advice given to you by your health care provider. Make sure you discuss any questions you have with your health care provider. ° °

## 2014-06-03 ENCOUNTER — Encounter: Payer: Self-pay | Admitting: Internal Medicine

## 2014-08-05 ENCOUNTER — Ambulatory Visit: Payer: Managed Care, Other (non HMO) | Admitting: Gastroenterology

## 2020-04-01 ENCOUNTER — Emergency Department (HOSPITAL_COMMUNITY)
Admission: EM | Admit: 2020-04-01 | Discharge: 2020-04-01 | Disposition: A | Discharge status: Home / Self Care | Payer: Managed Care, Other (non HMO) | Attending: Emergency Medicine | Admitting: Emergency Medicine

## 2020-04-01 ENCOUNTER — Emergency Department (HOSPITAL_COMMUNITY): Payer: Managed Care, Other (non HMO)

## 2020-04-01 ENCOUNTER — Other Ambulatory Visit: Payer: Self-pay

## 2020-04-01 ENCOUNTER — Encounter (HOSPITAL_COMMUNITY): Payer: Self-pay | Admitting: Pharmacy Technician

## 2020-04-01 DIAGNOSIS — S2241XA Multiple fractures of ribs, right side, initial encounter for closed fracture: Secondary | ICD-10-CM | POA: Insufficient documentation

## 2020-04-01 DIAGNOSIS — Y9389 Activity, other specified: Secondary | ICD-10-CM | POA: Insufficient documentation

## 2020-04-01 DIAGNOSIS — Z87891 Personal history of nicotine dependence: Secondary | ICD-10-CM | POA: Diagnosis not present

## 2020-04-01 DIAGNOSIS — W228XXA Striking against or struck by other objects, initial encounter: Secondary | ICD-10-CM | POA: Insufficient documentation

## 2020-04-01 DIAGNOSIS — Y999 Unspecified external cause status: Secondary | ICD-10-CM | POA: Diagnosis not present

## 2020-04-01 DIAGNOSIS — Y92014 Private driveway to single-family (private) house as the place of occurrence of the external cause: Secondary | ICD-10-CM | POA: Diagnosis not present

## 2020-04-01 DIAGNOSIS — S20211A Contusion of right front wall of thorax, initial encounter: Secondary | ICD-10-CM | POA: Insufficient documentation

## 2020-04-01 DIAGNOSIS — S61412A Laceration without foreign body of left hand, initial encounter: Secondary | ICD-10-CM | POA: Diagnosis not present

## 2020-04-01 DIAGNOSIS — S3991XA Unspecified injury of abdomen, initial encounter: Secondary | ICD-10-CM | POA: Diagnosis present

## 2020-04-01 LAB — URINALYSIS, ROUTINE W REFLEX MICROSCOPIC
Bilirubin Urine: NEGATIVE
Glucose, UA: NEGATIVE mg/dL
Hgb urine dipstick: NEGATIVE
Ketones, ur: NEGATIVE mg/dL
Leukocytes,Ua: NEGATIVE
Nitrite: NEGATIVE
Protein, ur: NEGATIVE mg/dL
Specific Gravity, Urine: 1.04 — ABNORMAL HIGH (ref 1.005–1.030)
pH: 5 (ref 5.0–8.0)

## 2020-04-01 LAB — I-STAT CHEM 8, ED
BUN: 23 mg/dL — ABNORMAL HIGH (ref 6–20)
Calcium, Ion: 1.14 mmol/L — ABNORMAL LOW (ref 1.15–1.40)
Chloride: 106 mmol/L (ref 98–111)
Creatinine, Ser: 1.1 mg/dL (ref 0.61–1.24)
Glucose, Bld: 129 mg/dL — ABNORMAL HIGH (ref 70–99)
HCT: 46 % (ref 39.0–52.0)
Hemoglobin: 15.6 g/dL (ref 13.0–17.0)
Potassium: 3.6 mmol/L (ref 3.5–5.1)
Sodium: 139 mmol/L (ref 135–145)
TCO2: 24 mmol/L (ref 22–32)

## 2020-04-01 LAB — LIPASE, BLOOD: Lipase: 48 U/L (ref 11–51)

## 2020-04-01 LAB — CBC WITH DIFFERENTIAL/PLATELET
Abs Immature Granulocytes: 0.09 10*3/uL — ABNORMAL HIGH (ref 0.00–0.07)
Basophils Absolute: 0.1 10*3/uL (ref 0.0–0.1)
Basophils Relative: 1 %
Eosinophils Absolute: 0.3 10*3/uL (ref 0.0–0.5)
Eosinophils Relative: 3 %
HCT: 49.3 % (ref 39.0–52.0)
Hemoglobin: 16 g/dL (ref 13.0–17.0)
Immature Granulocytes: 1 %
Lymphocytes Relative: 33 %
Lymphs Abs: 3.3 10*3/uL (ref 0.7–4.0)
MCH: 29.7 pg (ref 26.0–34.0)
MCHC: 32.5 g/dL (ref 30.0–36.0)
MCV: 91.5 fL (ref 80.0–100.0)
Monocytes Absolute: 0.7 10*3/uL (ref 0.1–1.0)
Monocytes Relative: 7 %
Neutro Abs: 5.8 10*3/uL (ref 1.7–7.7)
Neutrophils Relative %: 55 %
Platelets: 271 10*3/uL (ref 150–400)
RBC: 5.39 MIL/uL (ref 4.22–5.81)
RDW: 12.4 % (ref 11.5–15.5)
WBC: 10.3 10*3/uL (ref 4.0–10.5)
nRBC: 0 % (ref 0.0–0.2)

## 2020-04-01 LAB — COMPREHENSIVE METABOLIC PANEL
ALT: 70 U/L — ABNORMAL HIGH (ref 0–44)
AST: 40 U/L (ref 15–41)
Albumin: 3.9 g/dL (ref 3.5–5.0)
Alkaline Phosphatase: 56 U/L (ref 38–126)
Anion gap: 12 (ref 5–15)
BUN: 21 mg/dL — ABNORMAL HIGH (ref 6–20)
CO2: 20 mmol/L — ABNORMAL LOW (ref 22–32)
Calcium: 9 mg/dL (ref 8.9–10.3)
Chloride: 101 mmol/L (ref 98–111)
Creatinine, Ser: 1.16 mg/dL (ref 0.61–1.24)
GFR calc Af Amer: >60 mL/min (ref >60)
GFR calc non Af Amer: >60 mL/min (ref >60)
Glucose, Bld: 128 mg/dL — ABNORMAL HIGH (ref 70–99)
Potassium: 3.8 mmol/L (ref 3.5–5.1)
Sodium: 133 mmol/L — ABNORMAL LOW (ref 135–145)
Total Bilirubin: 0.6 mg/dL (ref 0.3–1.2)
Total Protein: 7.2 g/dL (ref 6.5–8.1)

## 2020-04-01 LAB — TROPONIN I (HIGH SENSITIVITY)
Troponin I (High Sensitivity): <2 ng/L (ref <18)
Troponin I (High Sensitivity): <2 ng/L (ref <18)

## 2020-04-01 MED ORDER — HYDROCODONE-ACETAMINOPHEN 5-325 MG PO TABS
1.0000 | ORAL_TABLET | Freq: Once | ORAL | Status: AC
  Administered 2020-04-01: 1 via ORAL
  Filled 2020-04-01: qty 1

## 2020-04-01 MED ORDER — IOHEXOL 300 MG/ML  SOLN
100.0000 mL | Freq: Once | INTRAMUSCULAR | Status: AC | PRN
  Administered 2020-04-01: 100 mL via INTRAVENOUS

## 2020-04-01 MED ORDER — LIDOCAINE HCL (PF) 1 % IJ SOLN
INTRAMUSCULAR | Status: AC
  Filled 2020-04-01: qty 30

## 2020-04-01 MED ORDER — HYDROMORPHONE HCL 1 MG/ML IJ SOLN
1.0000 mg | Freq: Once | INTRAMUSCULAR | Status: AC
  Administered 2020-04-01: 1 mg via INTRAVENOUS
  Filled 2020-04-01: qty 1

## 2020-04-01 MED ORDER — HYDROCODONE-ACETAMINOPHEN 5-325 MG PO TABS: 1.0000 | ORAL_TABLET | Freq: Four times a day (QID) | ORAL | 0 refills | Status: AC | PRN

## 2020-04-01 MED ORDER — MORPHINE SULFATE (PF) 4 MG/ML IV SOLN: 4.0000 mg | Freq: Once | INTRAVENOUS | Status: DC

## 2020-04-01 MED ORDER — KETOROLAC TROMETHAMINE 15 MG/ML IJ SOLN
15.0000 mg | Freq: Once | INTRAMUSCULAR | Status: AC
  Administered 2020-04-01: 15 mg via INTRAVENOUS
  Filled 2020-04-01: qty 1

## 2020-04-01 MED ORDER — IBUPROFEN 800 MG PO TABS: 800.0000 mg | ORAL_TABLET | Freq: Three times a day (TID) | ORAL | 0 refills | Status: AC | PRN

## 2020-04-01 MED ORDER — LIDOCAINE 5 % EX PTCH: 1.0000 | MEDICATED_PATCH | CUTANEOUS | 0 refills | Status: AC

## 2020-04-01 NOTE — ED Notes (Signed)
Pt ambulated independently in the room, pt was instructed on use of IS, verbalized understanding of use and was able to return demonstrate appropriately.
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
